# Patient Record
Sex: Male | Born: 1985 | Race: Black or African American | Hispanic: No | Marital: Married | Smoking: Never smoker
Health system: Southern US, Community
[De-identification: ages and names within clinical notes are randomized; demographics above are authoritative.]

## PROBLEM LIST (undated history)

## (undated) DIAGNOSIS — J45909 Unspecified asthma, uncomplicated: Secondary | ICD-10-CM

## (undated) HISTORY — PX: WRIST SURGERY: SHX841

---
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
## 2018-11-06 ENCOUNTER — Emergency Department (HOSPITAL_COMMUNITY)
Admission: EM | Admit: 2018-11-06 | Discharge: 2018-11-07 | Discharge status: Against Medical Advice | Payer: BLUE CROSS/BLUE SHIELD | Attending: Emergency Medicine | Admitting: Emergency Medicine

## 2018-11-06 DIAGNOSIS — Z5321 Procedure and treatment not carried out due to patient leaving prior to being seen by health care provider: Secondary | ICD-10-CM | POA: Insufficient documentation

## 2018-11-06 DIAGNOSIS — R509 Fever, unspecified: Secondary | ICD-10-CM | POA: Diagnosis not present

## 2018-11-06 NOTE — ED Triage Notes (Signed)
Pt reports cough, congestion and fever for a week. Despite taking OTC symptoms remain.  Denies sore throat

## 2018-11-07 ENCOUNTER — Ambulatory Visit (INDEPENDENT_AMBULATORY_CARE_PROVIDER_SITE_OTHER)
Admission: EM | Admit: 2018-11-07 | Discharge: 2018-11-07 | Disposition: A | Discharge status: Home / Self Care | Payer: BLUE CROSS/BLUE SHIELD | Source: Home / Self Care

## 2018-11-07 ENCOUNTER — Encounter (HOSPITAL_COMMUNITY): Payer: Self-pay | Admitting: Emergency Medicine

## 2018-11-07 ENCOUNTER — Other Ambulatory Visit: Payer: Self-pay

## 2018-11-07 DIAGNOSIS — B349 Viral infection, unspecified: Secondary | ICD-10-CM

## 2018-11-07 DIAGNOSIS — R0981 Nasal congestion: Secondary | ICD-10-CM | POA: Insufficient documentation

## 2018-11-07 MED ORDER — FLUTICASONE PROPIONATE 50 MCG/ACT NA SUSP: 2.0000 | Freq: Every day | NASAL | 0 refills | Status: DC

## 2018-11-07 MED ORDER — IPRATROPIUM BROMIDE 0.06 % NA SOLN: 2.0000 | Freq: Four times a day (QID) | NASAL | 0 refills | Status: DC

## 2018-11-07 MED ORDER — PREDNISONE 50 MG PO TABS: 50.0000 mg | ORAL_TABLET | Freq: Every day | ORAL | 0 refills | Status: DC

## 2018-11-07 MED ORDER — OXYMETAZOLINE HCL 0.05 % NA SOLN: 1.0000 | Freq: Two times a day (BID) | NASAL | 0 refills | Status: AC

## 2018-11-07 NOTE — Discharge Instructions (Signed)
Start prednisone as directed. Start afrin as needed and flonase as directed for the first 3 days. Discontinue afrin after 3 days as it can cause rebound congestion. Continue flonase and can add atrovent for further relief of nasal drainage.  You can use over the counter nasal saline rinse such as neti pot for nasal congestion. Keep hydrated, your urine should be clear to pale yellow in color. Tylenol/motrin for fever and pain. Monitor for any worsening of symptoms, chest pain, shortness of breath, wheezing, swelling of the throat, follow up for reevaluation.

## 2018-11-07 NOTE — ED Provider Notes (Signed)
[facility]    CSN: [id number] Arrival date & time: 11/07/18  4098     History   Chief Complaint Chief Complaint  Patient presents with  . Cough    HPI Joseph Rivas is a 33 y.o. male.   33 year old male comes in for 1 week history of URI symptoms. Has cough, rhinorrhea, nasal congestion. States has been having trouble breathing through his nose. Denies fever, chills, night sweats. otc cold medicine without relief. Never smoker. Positive sick contact.      History reviewed. No pertinent past medical history.  There are no active problems to display for this patient.   Past Surgical History:  Procedure Laterality Date  . WRIST SURGERY         Home Medications    Prior to Admission medications   Medication Sig Start Date End Date Taking? Authorizing Provider  guaiFENesin (MUCINEX PO) Take by mouth.   Yes [provider]  fluticasone (FLONASE) 50 MCG/ACT nasal spray Place 2 sprays into both nostrils daily. 11/07/18   Cathie Hoops, Kerianne Gurr V, PA-C  ipratropium (ATROVENT) 0.06 % nasal spray Place 2 sprays into both nostrils 4 (four) times daily. 11/07/18   Cathie Hoops, Dennis Killilea V, PA-C  oxymetazoline (AFRIN NASAL SPRAY) 0.05 % nasal spray Place 1 spray into both nostrils 2 (two) times daily. 11/07/18   Cathie Hoops, Rielle Schlauch V, PA-C  predniSONE (DELTASONE) 50 MG tablet Take 1 tablet (50 mg total) by mouth daily. 11/07/18   Belinda Fisher, PA-C    Family History Family History  Problem Relation Age of Onset  . Hypertension Mother   . Healthy Father     Social History Social History   Tobacco Use  . Smoking status: Never Smoker  Substance Use Topics  . Alcohol use: Yes  . Drug use: Never     Allergies   Patient has no known allergies.   Review of Systems Review of Systems  Reason unable to perform ROS: See HPI as above.     Physical Exam Triage Vital Signs ED Triage Vitals  Enc Vitals Group     BP 11/07/18 0834 (!) 153/69     Pulse Rate 11/07/18 0834 62     Resp 11/07/18 0834  16     Temp 11/07/18 0834 97.8 F (36.6 C)     Temp Source 11/07/18 0834 Oral     SpO2 11/07/18 0834 100 %     Weight --      Height --      Head Circumference --      Peak Flow --      Pain Score 11/07/18 0837 6     Pain Loc --      Pain Edu? --      Excl. in GC? --    No data found.  Updated Vital Signs BP (!) 153/69 (BP Location: Left Arm)   Pulse 62   Temp 97.8 F (36.6 C) (Oral)   Resp 16   SpO2 100%   Physical Exam Constitutional:      General: He is not in acute distress.    Appearance: He is well-developed. He is not ill-appearing, toxic-appearing or diaphoretic.  HENT:     Head: Normocephalic and atraumatic.     Comments: Right nostril nonpatent.    Right Ear: Ear canal and external ear normal. A middle ear effusion is present. Tympanic membrane is not erythematous or bulging.     Left Ear: Ear canal and external ear normal.  Ears:     Comments: Left ear with cerumen impaction, TM not visible.    Nose: Congestion and rhinorrhea present.     Right Sinus: No maxillary sinus tenderness or frontal sinus tenderness.     Left Sinus: No maxillary sinus tenderness or frontal sinus tenderness.     Mouth/Throat:     Pharynx: Uvula midline.  Eyes:     Conjunctiva/sclera: Conjunctivae normal.     Pupils: Pupils are equal, round, and reactive to light.  Neck:     Musculoskeletal: Normal range of motion and neck supple.  Cardiovascular:     Rate and Rhythm: Normal rate and regular rhythm.     Heart sounds: Normal heart sounds. No murmur. No friction rub. No gallop.   Pulmonary:     Effort: Pulmonary effort is normal.     Breath sounds: Normal breath sounds. No decreased breath sounds, wheezing, rhonchi or rales.  Lymphadenopathy:     Cervical: No cervical adenopathy.  Skin:    General: Skin is warm and dry.  Neurological:     Mental Status: He is alert and oriented to person, place, and time.  Psychiatric:        Behavior: Behavior normal.        Judgment:  Judgment normal.      UC Treatments / Results  Labs (all labs ordered are listed, but only abnormal results are displayed) Labs Reviewed - No data to display  EKG None  Radiology No results found.  Procedures Procedures (including critical care time)  Medications Ordered in UC Medications - No data to display  Initial Impression / Assessment and Plan / UC Course  I have reviewed the triage vital signs and the nursing notes.  Pertinent labs & imaging results that were available during my care of the patient were reviewed by me and considered in my medical decision making (see chart for details).    Discussed with patient history and exam most consistent with viral URI. Symptomatic treatment as needed. Push fluids. Return precautions given.   Final Clinical Impressions(s) / UC Diagnoses   Final diagnoses:  Viral illness  Nasal congestion    ED Prescriptions    Medication Sig Dispense Auth. Provider   ipratropium (ATROVENT) 0.06 % nasal spray Place 2 sprays into both nostrils 4 (four) times daily. 15 mL Sully Dyment V, PA-C   fluticasone (FLONASE) 50 MCG/ACT nasal spray Place 2 sprays into both nostrils daily. 1 g Larz Mark V, PA-C   oxymetazoline (AFRIN NASAL SPRAY) 0.05 % nasal spray Place 1 spray into both nostrils 2 (two) times daily. 30 mL Yukari Flax V, PA-C   predniSONE (DELTASONE) 50 MG tablet Take 1 tablet (50 mg total) by mouth daily. 5 tablet Threasa AlphaYu, Legion Discher V, PA-C        Heer Justiss V, [place]PA-C 11/07/18 [phone]

## 2018-11-07 NOTE — ED Triage Notes (Signed)
cough and head congestion, stuffy nose.  Onset of symptoms Tuesday 10/30/2018

## 2018-11-07 NOTE — ED Notes (Signed)
Pt left. 

## 2018-11-16 ENCOUNTER — Ambulatory Visit (HOSPITAL_COMMUNITY)
Admission: EM | Admit: 2018-11-16 | Discharge: 2018-11-16 | Disposition: A | Discharge status: Home / Self Care | Payer: BLUE CROSS/BLUE SHIELD | Attending: Internal Medicine | Admitting: Internal Medicine

## 2018-11-16 ENCOUNTER — Other Ambulatory Visit: Payer: Self-pay

## 2018-11-16 ENCOUNTER — Encounter (HOSPITAL_COMMUNITY): Payer: Self-pay | Admitting: Emergency Medicine

## 2018-11-16 DIAGNOSIS — J189 Pneumonia, unspecified organism: Secondary | ICD-10-CM

## 2018-11-16 DIAGNOSIS — J181 Lobar pneumonia, unspecified organism: Secondary | ICD-10-CM | POA: Insufficient documentation

## 2018-11-16 MED ORDER — AMOXICILLIN-POT CLAVULANATE 875-125 MG PO TABS: 1.0000 | ORAL_TABLET | Freq: Two times a day (BID) | ORAL | 0 refills | Status: DC

## 2018-11-16 MED ORDER — ALBUTEROL SULFATE HFA 108 (90 BASE) MCG/ACT IN AERS: 2.0000 | INHALATION_SPRAY | RESPIRATORY_TRACT | 0 refills | Status: AC | PRN

## 2018-11-16 MED ORDER — AZITHROMYCIN 250 MG PO TABS: 250.0000 mg | ORAL_TABLET | Freq: Every day | ORAL | 0 refills | Status: DC

## 2018-11-16 NOTE — ED Triage Notes (Signed)
PT reports cough for 3 weeks. PT reports sinus issues and SOB as well. PT has had some wheezing as well.

## 2019-03-05 DIAGNOSIS — Z0000 Encounter for general adult medical examination without abnormal findings: Secondary | ICD-10-CM | POA: Diagnosis not present

## 2019-03-11 DIAGNOSIS — Z0000 Encounter for general adult medical examination without abnormal findings: Secondary | ICD-10-CM | POA: Diagnosis not present

## 2019-03-11 DIAGNOSIS — Z23 Encounter for immunization: Secondary | ICD-10-CM | POA: Diagnosis not present

## 2019-03-11 DIAGNOSIS — Z13220 Encounter for screening for lipoid disorders: Secondary | ICD-10-CM | POA: Diagnosis not present

## 2019-05-29 DIAGNOSIS — R05 Cough: Secondary | ICD-10-CM | POA: Diagnosis not present

## 2019-05-29 DIAGNOSIS — R0602 Shortness of breath: Secondary | ICD-10-CM | POA: Diagnosis not present

## 2019-05-29 DIAGNOSIS — R6883 Chills (without fever): Secondary | ICD-10-CM | POA: Diagnosis not present

## 2019-05-29 DIAGNOSIS — R197 Diarrhea, unspecified: Secondary | ICD-10-CM | POA: Diagnosis not present

## 2019-05-29 DIAGNOSIS — R110 Nausea: Secondary | ICD-10-CM | POA: Diagnosis not present

## 2019-10-04 DIAGNOSIS — T148XXA Other injury of unspecified body region, initial encounter: Secondary | ICD-10-CM | POA: Diagnosis not present

## 2019-10-17 DIAGNOSIS — Z20828 Contact with and (suspected) exposure to other viral communicable diseases: Secondary | ICD-10-CM | POA: Diagnosis not present

## 2019-10-22 DIAGNOSIS — M7989 Other specified soft tissue disorders: Secondary | ICD-10-CM | POA: Diagnosis not present

## 2019-10-28 DIAGNOSIS — R600 Localized edema: Secondary | ICD-10-CM | POA: Diagnosis not present

## 2019-10-28 DIAGNOSIS — R2232 Localized swelling, mass and lump, left upper limb: Secondary | ICD-10-CM | POA: Diagnosis not present

## 2019-10-28 DIAGNOSIS — R609 Edema, unspecified: Secondary | ICD-10-CM | POA: Diagnosis not present

## 2019-10-30 ENCOUNTER — Other Ambulatory Visit: Payer: Self-pay | Admitting: Orthopedic Surgery

## 2019-10-30 DIAGNOSIS — R609 Edema, unspecified: Secondary | ICD-10-CM

## 2019-10-30 DIAGNOSIS — R2232 Localized swelling, mass and lump, left upper limb: Secondary | ICD-10-CM

## 2019-11-08 ENCOUNTER — Ambulatory Visit
Admission: RE | Admit: 2019-11-08 | Discharge: 2019-11-08 | Disposition: A | Discharge status: Home / Self Care | Payer: BLUE CROSS/BLUE SHIELD | Source: Ambulatory Visit | Attending: Orthopedic Surgery | Admitting: Orthopedic Surgery

## 2019-11-08 DIAGNOSIS — R2232 Localized swelling, mass and lump, left upper limb: Secondary | ICD-10-CM

## 2019-11-08 DIAGNOSIS — R609 Edema, unspecified: Secondary | ICD-10-CM

## 2019-11-15 ENCOUNTER — Other Ambulatory Visit: Payer: Self-pay | Admitting: Orthopedic Surgery

## 2019-11-15 DIAGNOSIS — R2232 Localized swelling, mass and lump, left upper limb: Secondary | ICD-10-CM | POA: Diagnosis not present

## 2019-11-22 ENCOUNTER — Other Ambulatory Visit (HOSPITAL_COMMUNITY): Payer: BC Managed Care – PPO

## 2019-12-17 ENCOUNTER — Encounter (HOSPITAL_BASED_OUTPATIENT_CLINIC_OR_DEPARTMENT_OTHER): Payer: Self-pay | Admitting: Orthopedic Surgery

## 2019-12-17 ENCOUNTER — Other Ambulatory Visit: Payer: Self-pay

## 2019-12-18 ENCOUNTER — Other Ambulatory Visit (HOSPITAL_COMMUNITY): Payer: BC Managed Care – PPO

## 2019-12-20 ENCOUNTER — Other Ambulatory Visit (HOSPITAL_COMMUNITY)
Admission: RE | Admit: 2019-12-20 | Discharge: 2019-12-20 | Disposition: A | Discharge status: Home / Self Care | Payer: BC Managed Care – PPO | Source: Ambulatory Visit | Attending: Orthopedic Surgery | Admitting: Orthopedic Surgery

## 2019-12-20 DIAGNOSIS — Z01812 Encounter for preprocedural laboratory examination: Secondary | ICD-10-CM | POA: Insufficient documentation

## 2019-12-20 DIAGNOSIS — U071 COVID-19: Secondary | ICD-10-CM | POA: Insufficient documentation

## 2019-12-21 LAB — SARS CORONAVIRUS 2 (TAT 6-24 HRS): SARS Coronavirus 2: POSITIVE — AB

## 2019-12-22 DIAGNOSIS — Z20828 Contact with and (suspected) exposure to other viral communicable diseases: Secondary | ICD-10-CM | POA: Diagnosis not present

## 2019-12-23 NOTE — Progress Notes (Signed)
Notified Joseph Rivas at Dr. Merrilee Seashore office of positive covid test.

## 2019-12-24 ENCOUNTER — Ambulatory Visit (HOSPITAL_BASED_OUTPATIENT_CLINIC_OR_DEPARTMENT_OTHER)
Admission: RE | Admit: 2019-12-24 | Payer: BC Managed Care – PPO | Source: Home / Self Care | Admitting: Orthopedic Surgery

## 2019-12-24 HISTORY — DX: Unspecified asthma, uncomplicated: J45.909

## 2019-12-24 SURGERY — EXCISION MASS
Anesthesia: Regional | Laterality: Left

## 2021-09-12 IMAGING — US US EXTREM UP*L* COMP
1 series · 12 of 12 positions shown · non-contrast
Comparison: None.

CLINICAL DATA: Mass of the left little finger.

EXAM:
ULTRASOUND LEFT UPPER EXTREMITY COMPLETE
TECHNIQUE: Ultrasound examination was performed including evaluation of the
muscles, tendons, joint, and adjacent soft tissues.

[Series 1: us extrem up*left* comp · 0.05mm/px · 12 of 12 slices shown]
[im 1/12]
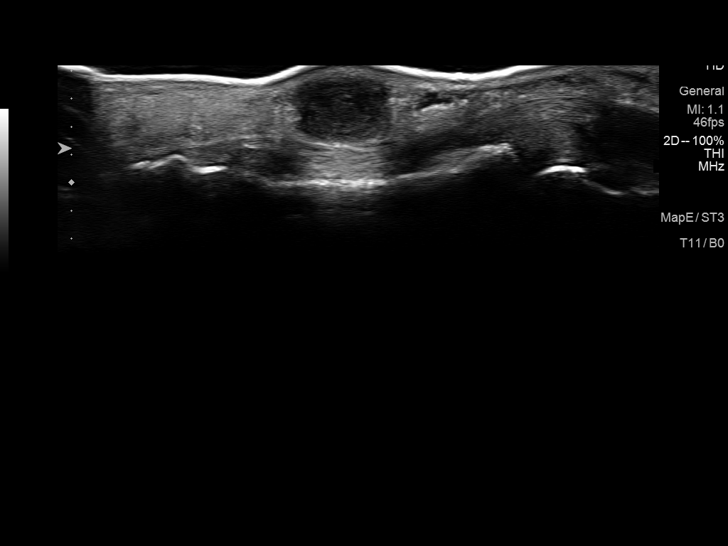
[im 2/12]
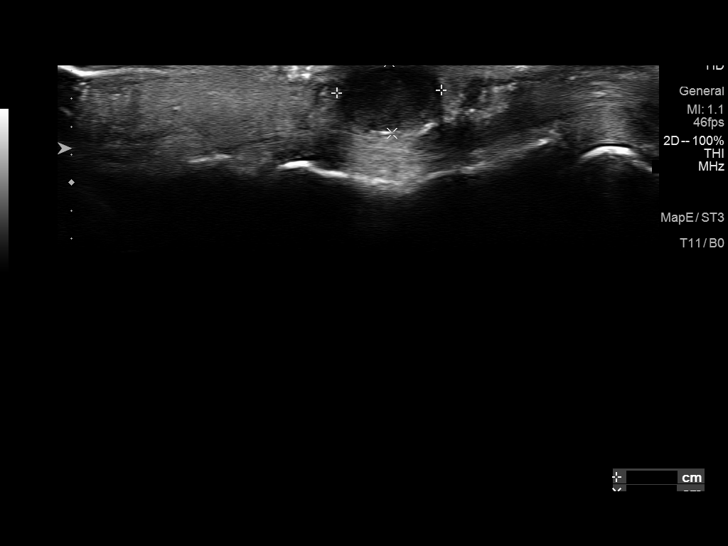
[im 3/12]
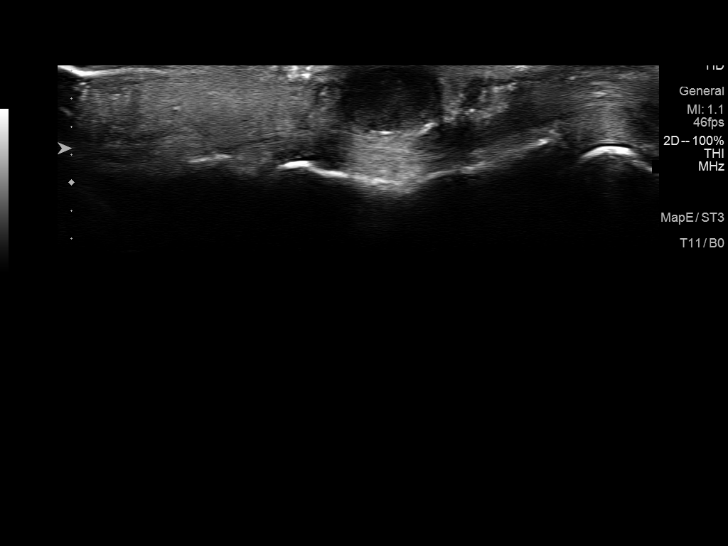
[im 4/12]
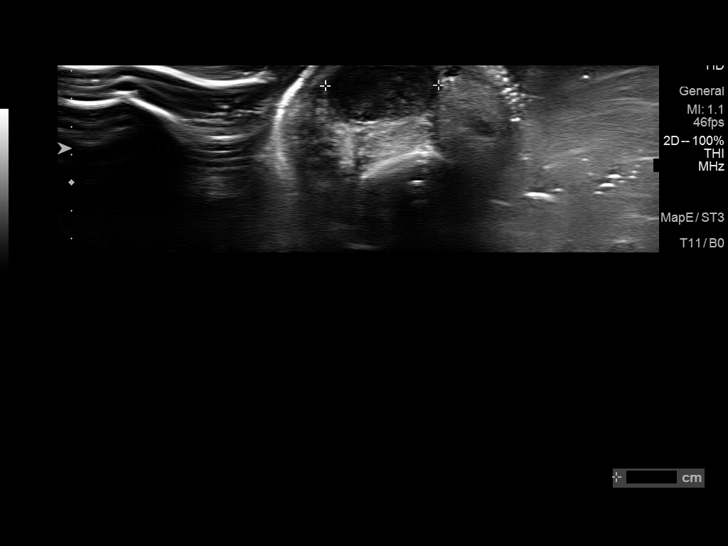
[im 5/12]
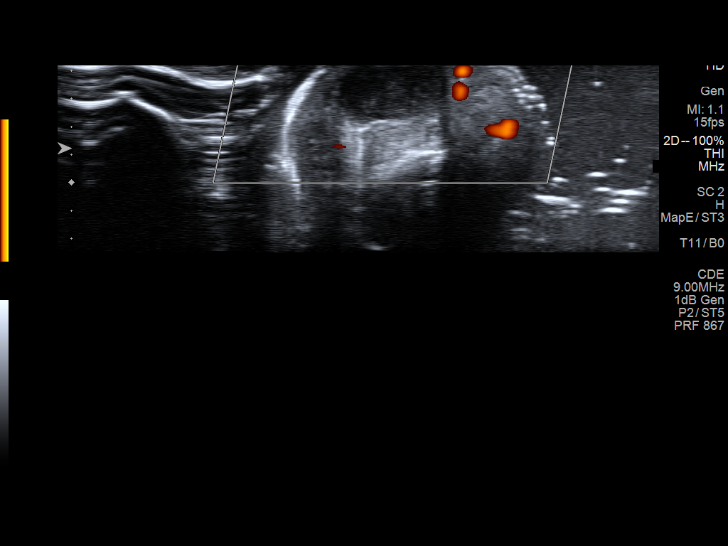
[im 6/12]
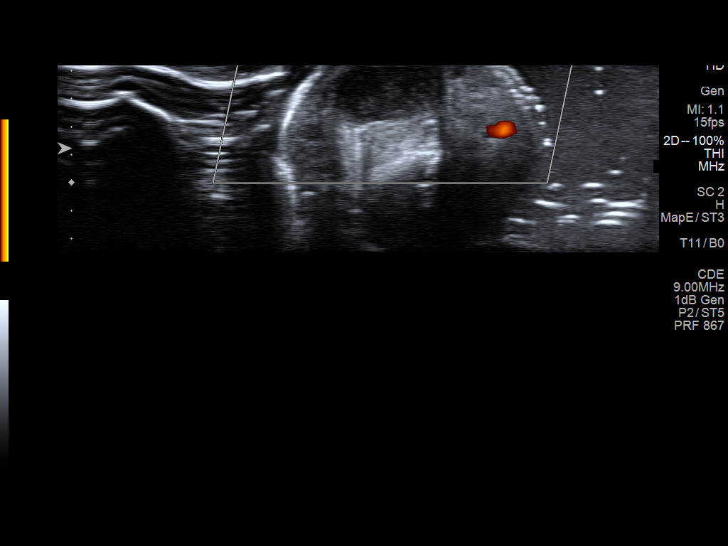
[im 7/12]
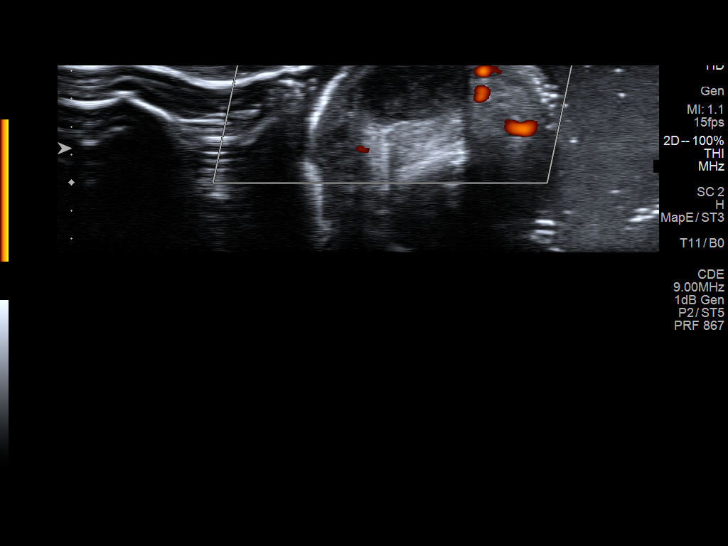
[im 8/12]
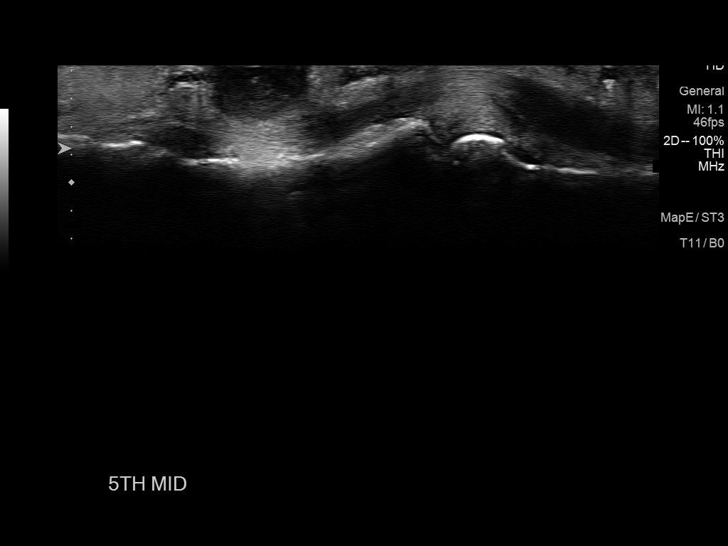
[im 9/12]
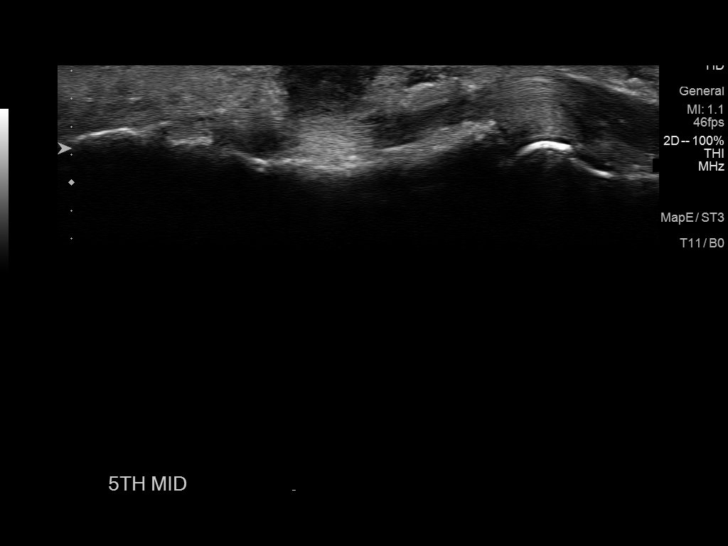
[im 10/12]
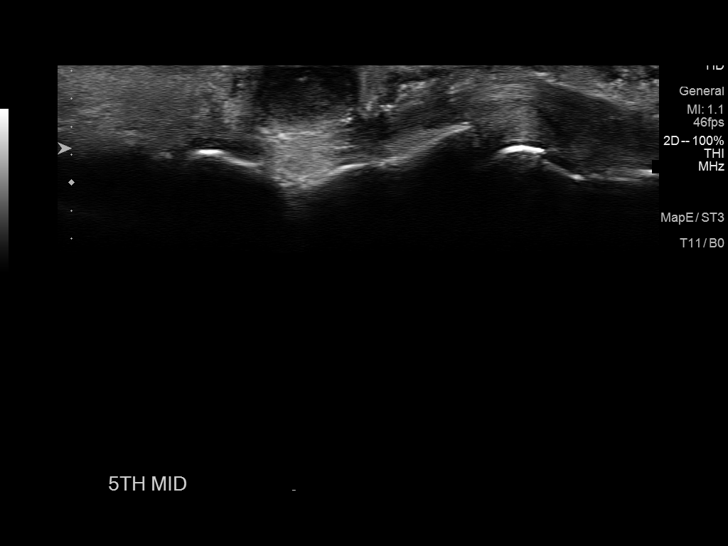
[im 11/12]
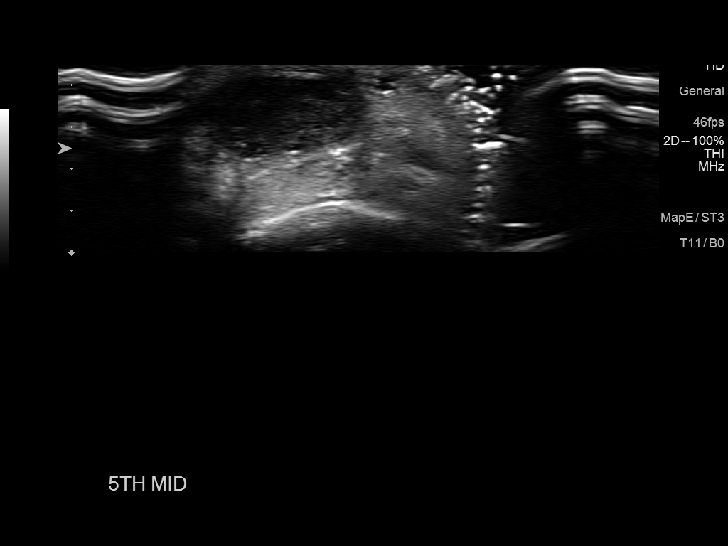
[im 12/12]
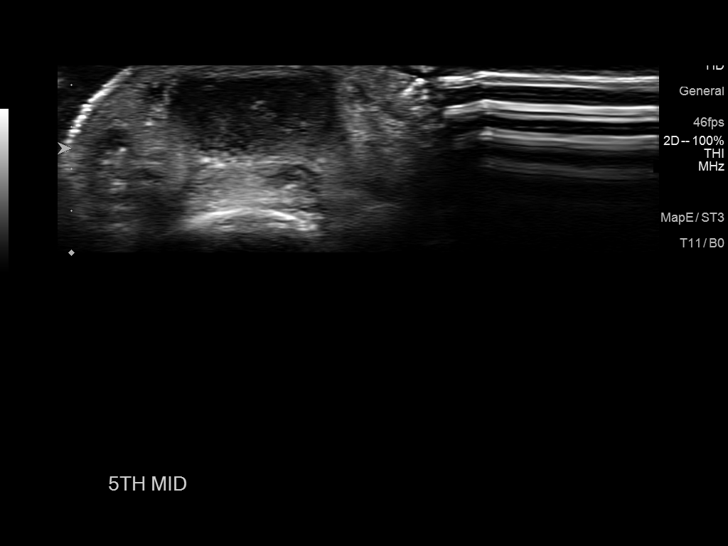

[12 of 12 positions shown; findings below may reference images not displayed]

FINDINGS: There is a well-defined 8 x 8 x 5 mm soft tissue mass superficial to
the flexor tendon at the level of the mid middle phalanx of the
little finger. There is no internal blood flow. There is good
posterior back wall sonographic enhancement with what appears to be
debris in this complex lesion.

The underlying tendon appears normal. No appreciable effusions at
the DIP joint or PIP joint.
IMPRESSION: Well-defined 8 x 8 x 5 mm soft tissue mass. Imaging characteristics
could represent either a giant cell tumor of the tendon sheath or a
ganglion cyst containing hemorrhage. The location is not typical for
a ganglion cyst but there is bruising at the site.

## 2021-11-09 DIAGNOSIS — J069 Acute upper respiratory infection, unspecified: Secondary | ICD-10-CM | POA: Diagnosis not present

## 2021-11-10 DIAGNOSIS — R040 Epistaxis: Secondary | ICD-10-CM | POA: Diagnosis not present

## 2021-11-30 DIAGNOSIS — Z13220 Encounter for screening for lipoid disorders: Secondary | ICD-10-CM | POA: Diagnosis not present

## 2021-11-30 DIAGNOSIS — Z23 Encounter for immunization: Secondary | ICD-10-CM | POA: Diagnosis not present

## 2021-11-30 DIAGNOSIS — Z0000 Encounter for general adult medical examination without abnormal findings: Secondary | ICD-10-CM | POA: Diagnosis not present

## 2022-03-08 DIAGNOSIS — R21 Rash and other nonspecific skin eruption: Secondary | ICD-10-CM | POA: Diagnosis not present

## 2022-05-17 DIAGNOSIS — R21 Rash and other nonspecific skin eruption: Secondary | ICD-10-CM | POA: Diagnosis not present

## 2022-08-04 DIAGNOSIS — L442 Lichen striatus: Secondary | ICD-10-CM | POA: Diagnosis not present

## 2022-08-04 DIAGNOSIS — E663 Overweight: Secondary | ICD-10-CM | POA: Diagnosis not present

## 2022-12-06 DIAGNOSIS — Z13220 Encounter for screening for lipoid disorders: Secondary | ICD-10-CM | POA: Diagnosis not present

## 2022-12-06 DIAGNOSIS — Z23 Encounter for immunization: Secondary | ICD-10-CM | POA: Diagnosis not present

## 2022-12-06 DIAGNOSIS — Z0000 Encounter for general adult medical examination without abnormal findings: Secondary | ICD-10-CM | POA: Diagnosis not present

## 2023-06-29 DIAGNOSIS — U071 COVID-19: Secondary | ICD-10-CM | POA: Diagnosis not present

## 2024-07-09 DIAGNOSIS — S8991XA Unspecified injury of right lower leg, initial encounter: Secondary | ICD-10-CM | POA: Diagnosis not present

## 2024-07-11 DIAGNOSIS — M25561 Pain in right knee: Secondary | ICD-10-CM | POA: Diagnosis not present

## 2024-07-11 DIAGNOSIS — M25461 Effusion, right knee: Secondary | ICD-10-CM | POA: Diagnosis not present

## 2024-07-23 DIAGNOSIS — M25561 Pain in right knee: Secondary | ICD-10-CM | POA: Diagnosis not present

## 2024-07-26 DIAGNOSIS — S83511A Sprain of anterior cruciate ligament of right knee, initial encounter: Secondary | ICD-10-CM | POA: Diagnosis not present

## 2024-08-14 DIAGNOSIS — G8918 Other acute postprocedural pain: Secondary | ICD-10-CM | POA: Diagnosis not present

## 2024-08-14 DIAGNOSIS — S83281A Other tear of lateral meniscus, current injury, right knee, initial encounter: Secondary | ICD-10-CM | POA: Diagnosis not present

## 2024-08-14 DIAGNOSIS — S83511A Sprain of anterior cruciate ligament of right knee, initial encounter: Secondary | ICD-10-CM | POA: Diagnosis not present

## 2024-08-14 DIAGNOSIS — M25361 Other instability, right knee: Secondary | ICD-10-CM | POA: Diagnosis not present

## 2024-08-14 DIAGNOSIS — Y999 Unspecified external cause status: Secondary | ICD-10-CM | POA: Diagnosis not present

## 2024-08-14 DIAGNOSIS — X58XXXA Exposure to other specified factors, initial encounter: Secondary | ICD-10-CM | POA: Diagnosis not present

## 2024-08-20 DIAGNOSIS — Z9889 Other specified postprocedural states: Secondary | ICD-10-CM | POA: Diagnosis not present

## 2024-09-04 DIAGNOSIS — M25661 Stiffness of right knee, not elsewhere classified: Secondary | ICD-10-CM | POA: Diagnosis not present

## 2024-09-04 DIAGNOSIS — M6281 Muscle weakness (generalized): Secondary | ICD-10-CM | POA: Diagnosis not present

## 2024-09-04 DIAGNOSIS — R262 Difficulty in walking, not elsewhere classified: Secondary | ICD-10-CM | POA: Diagnosis not present

## 2024-09-04 DIAGNOSIS — S83501D Sprain of unspecified cruciate ligament of right knee, subsequent encounter: Secondary | ICD-10-CM | POA: Diagnosis not present

## 2024-09-09 DIAGNOSIS — S83501D Sprain of unspecified cruciate ligament of right knee, subsequent encounter: Secondary | ICD-10-CM | POA: Diagnosis not present

## 2024-09-09 DIAGNOSIS — M25661 Stiffness of right knee, not elsewhere classified: Secondary | ICD-10-CM | POA: Diagnosis not present

## 2024-09-09 DIAGNOSIS — M6281 Muscle weakness (generalized): Secondary | ICD-10-CM | POA: Diagnosis not present

## 2024-09-09 DIAGNOSIS — R262 Difficulty in walking, not elsewhere classified: Secondary | ICD-10-CM | POA: Diagnosis not present

## 2024-09-24 DIAGNOSIS — M25661 Stiffness of right knee, not elsewhere classified: Secondary | ICD-10-CM | POA: Diagnosis not present

## 2024-09-24 DIAGNOSIS — S83501D Sprain of unspecified cruciate ligament of right knee, subsequent encounter: Secondary | ICD-10-CM | POA: Diagnosis not present

## 2024-09-24 DIAGNOSIS — R262 Difficulty in walking, not elsewhere classified: Secondary | ICD-10-CM | POA: Diagnosis not present

## 2024-09-24 DIAGNOSIS — M6281 Muscle weakness (generalized): Secondary | ICD-10-CM | POA: Diagnosis not present

## 2024-10-01 DIAGNOSIS — R262 Difficulty in walking, not elsewhere classified: Secondary | ICD-10-CM | POA: Diagnosis not present

## 2024-10-01 DIAGNOSIS — S83501D Sprain of unspecified cruciate ligament of right knee, subsequent encounter: Secondary | ICD-10-CM | POA: Diagnosis not present

## 2024-10-01 DIAGNOSIS — M6281 Muscle weakness (generalized): Secondary | ICD-10-CM | POA: Diagnosis not present

## 2024-10-01 DIAGNOSIS — M25661 Stiffness of right knee, not elsewhere classified: Secondary | ICD-10-CM | POA: Diagnosis not present
